# Patient Record
Sex: Male | Born: 1995 | Race: Black or African American | Hispanic: No | Marital: Single | Smoking: Light tobacco smoker
Health system: Southern US, Community
[De-identification: ages and names within clinical notes are randomized; demographics above are authoritative.]

---
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
## 2014-02-02 ENCOUNTER — Emergency Department: Payer: Self-pay | Admitting: Emergency Medicine

## 2014-11-21 ENCOUNTER — Emergency Department
Admission: EM | Admit: 2014-11-21 | Discharge: 2014-11-21 | Disposition: A | Discharge status: Home / Self Care | Payer: Self-pay | Attending: Emergency Medicine | Admitting: Emergency Medicine

## 2014-11-21 ENCOUNTER — Encounter: Payer: Self-pay | Admitting: Student

## 2014-11-21 DIAGNOSIS — Z202 Contact with and (suspected) exposure to infections with a predominantly sexual mode of transmission: Secondary | ICD-10-CM | POA: Insufficient documentation

## 2014-11-21 DIAGNOSIS — Z720 Tobacco use: Secondary | ICD-10-CM | POA: Insufficient documentation

## 2014-11-21 MED ORDER — METRONIDAZOLE 250 MG PO TABS
2000.0000 mg | ORAL_TABLET | Freq: Once | ORAL | Status: DC
  Filled 2014-11-21: qty 8

## 2014-11-21 MED ORDER — METRONIDAZOLE 250 MG PO TABS
2000.0000 mg | ORAL_TABLET | Freq: Once | ORAL | Status: AC
  Administered 2014-11-21: 2000 mg via ORAL

## 2014-11-21 NOTE — ED Provider Notes (Signed)
[facility] Emergency Department Provider Note  ____________________________________________  Time seen: Approximately 4:33 PM  I have reviewed the triage vital signs and the nursing notes.   HISTORY  Chief Complaint Exposure to STD    HPI Brian Santos is a 19 y.o. male male today status post STD. Patient has significant other was tested and treated for Trichomonas. He denies any symptoms but request for STD testing. Patient states that he has had history STD in the past.   History reviewed. No pertinent past medical history.  There are no active problems to display for this patient.   History reviewed. No pertinent past surgical history.  No current outpatient prescriptions on file.  Allergies Review of patient's allergies indicates no known allergies.  No family history on file.  Social History History  Substance Use Topics  . Smoking status: Light Tobacco Smoker  . Smokeless tobacco: Not on file  . Alcohol Use: Yes     Comment: 4 bottles liquor per week    Review of Systems Constitutional: No fever/chills Eyes: No visual changes. ENT: No sore throat. Cardiovascular: Denies chest pain. Respiratory: Denies shortness of breath. Gastrointestinal: No abdominal pain.  No nausea, no vomiting.  No diarrhea.  No constipation. Genitourinary: Negative for dysuria. Musculoskeletal: Negative for back pain. Skin: Negative for rash. Neurological: Negative for headaches, focal weakness or numbness.  10-point ROS otherwise negative.  ____________________________________________   PHYSICAL EXAM:  VITAL SIGNS: ED Triage Vitals  Enc Vitals Group     BP 11/21/14 1603 124/77 mmHg     Pulse Rate 11/21/14 1603 83     Resp 11/21/14 1603 18     Temp 11/21/14 1603 99 F (37.2 C)     Temp Source 11/21/14 1603 Oral     SpO2 11/21/14 1603 98 %     Weight 11/21/14 1603 170 lb (77.111 kg)     Height 11/21/14 1603 5\' 9"  (1.753 m)     Head Cir --       Peak Flow --      Pain Score --      Pain Loc --      Pain Edu? --      Excl. in GC? --     Constitutional: Alert and oriented. Well appearing and in no acute distress. Eyes: Conjunctivae are normal. PERRL. EOMI. Head: Atraumatic. Nose: No congestion/rhinnorhea. Mouth/Throat: Mucous membranes are moist.  Oropharynx non-erythematous. Neck: No stridor.  No cervical spine tenderness to palpation. Cardiovascular: Normal rate, regular rhythm. Grossly normal heart sounds.  Good peripheral circulation. Respiratory: Normal respiratory effort.  No retractions. Lungs CTAB. Gastrointestinal: Soft and nontender. No distention. No abdominal bruits. No CVA tenderness. Genitourinary: No penial lesions no urethral discharge. Musculoskeletal: No lower extremity tenderness nor edema.  No joint effusions. Neurologic:  Normal speech and language. No gross focal neurologic deficits are appreciated. No gait instability. Skin:  Skin is warm, dry and intact. No rash noted. Psychiatric: Mood and affect are normal. Speech and behavior are normal.  ____________________________________________   LABS (all labs ordered are listed, but only abnormal results are displayed)  Labs Reviewed - No data to display ____________________________________________  EKG   ____________________________________________  RADIOLOGY   ____________________________________________   PROCEDURES  Procedure(s) performed:   Critical Care performed: No  ____________________________________________   INITIAL IMPRESSION / ASSESSMENT AND PLAN / ED COURSE  Pertinent labs & imaging results that were available during my care of the patient were reviewed by me and considered in my medical  decision making (see chart for details). Asymptomatic STD exposure. We'll treated with 2 g of Flagyl have patient follow-up at the [facility] for further  evaluation. ____________________________________________   FINAL CLINICAL IMPRESSION(S) / ED DIAGNOSES  Final diagnoses:  Exposure to STD      Joni Reining, PA-C 11/21/14 1646  Jene Every, MD 11/22/14 2322

## 2014-11-21 NOTE — ED Notes (Addendum)
Pt here reporting exposure to STD. Requests testing for "all of them". Denies any symptoms.
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
## 2016-08-09 ENCOUNTER — Emergency Department (HOSPITAL_COMMUNITY)
Admission: EM | Admit: 2016-08-09 | Discharge: 2016-08-09 | Disposition: A | Discharge status: Home / Self Care | Payer: Self-pay | Attending: Emergency Medicine | Admitting: Emergency Medicine

## 2016-08-09 ENCOUNTER — Encounter (HOSPITAL_COMMUNITY): Payer: Self-pay | Admitting: *Deleted

## 2016-08-09 DIAGNOSIS — S0181XA Laceration without foreign body of other part of head, initial encounter: Secondary | ICD-10-CM

## 2016-08-09 DIAGNOSIS — Y999 Unspecified external cause status: Secondary | ICD-10-CM | POA: Insufficient documentation

## 2016-08-09 DIAGNOSIS — S01112A Laceration without foreign body of left eyelid and periocular area, initial encounter: Secondary | ICD-10-CM | POA: Insufficient documentation

## 2016-08-09 DIAGNOSIS — Y939 Activity, unspecified: Secondary | ICD-10-CM | POA: Insufficient documentation

## 2016-08-09 DIAGNOSIS — Z23 Encounter for immunization: Secondary | ICD-10-CM | POA: Insufficient documentation

## 2016-08-09 DIAGNOSIS — W228XXA Striking against or struck by other objects, initial encounter: Secondary | ICD-10-CM | POA: Insufficient documentation

## 2016-08-09 DIAGNOSIS — F17200 Nicotine dependence, unspecified, uncomplicated: Secondary | ICD-10-CM | POA: Insufficient documentation

## 2016-08-09 DIAGNOSIS — Y929 Unspecified place or not applicable: Secondary | ICD-10-CM | POA: Insufficient documentation

## 2016-08-09 MED ORDER — TETANUS-DIPHTH-ACELL PERTUSSIS 5-2.5-18.5 LF-MCG/0.5 IM SUSP
0.5000 mL | Freq: Once | INTRAMUSCULAR | Status: AC
  Administered 2016-08-09: 0.5 mL via INTRAMUSCULAR
  Filled 2016-08-09: qty 0.5

## 2016-08-09 MED ORDER — LIDOCAINE-EPINEPHRINE-TETRACAINE (LET) SOLUTION
3.0000 mL | Freq: Once | NASAL | Status: AC
  Administered 2016-08-09: 3 mL via TOPICAL
  Filled 2016-08-09: qty 3

## 2016-08-09 MED ORDER — LIDOCAINE HCL (PF) 1 % IJ SOLN
INTRAMUSCULAR | Status: AC
  Administered 2016-08-09: 5 mL
  Filled 2016-08-09: qty 5

## 2016-08-09 NOTE — Discharge Instructions (Signed)
Take Tylenol or ibuprofen for any discomfort. Cool compresses will help reduce swelling.
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
## 2016-08-18 ENCOUNTER — Encounter (HOSPITAL_COMMUNITY): Payer: Self-pay | Admitting: *Deleted

## 2016-08-18 ENCOUNTER — Emergency Department (HOSPITAL_COMMUNITY)
Admission: EM | Admit: 2016-08-18 | Discharge: 2016-08-18 | Disposition: A | Discharge status: Home / Self Care | Payer: Medicaid Other | Attending: Emergency Medicine | Admitting: Emergency Medicine

## 2016-08-18 DIAGNOSIS — Z4802 Encounter for removal of sutures: Secondary | ICD-10-CM | POA: Insufficient documentation

## 2016-08-18 DIAGNOSIS — F17200 Nicotine dependence, unspecified, uncomplicated: Secondary | ICD-10-CM | POA: Insufficient documentation
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
## 2016-08-18 NOTE — Discharge Instructions (Signed)
Return if any problems.

## 2016-08-18 NOTE — ED Triage Notes (Signed)
Pt hrere for suture removal from left eyebrow that was placed on the 14th.
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
## 2016-08-28 ENCOUNTER — Emergency Department
Admission: EM | Admit: 2016-08-28 | Discharge: 2016-08-28 | Disposition: A | Discharge status: Home / Self Care | Payer: Medicaid Other | Attending: Emergency Medicine | Admitting: Emergency Medicine

## 2016-08-28 ENCOUNTER — Encounter: Payer: Self-pay | Admitting: *Deleted

## 2016-08-28 DIAGNOSIS — F17200 Nicotine dependence, unspecified, uncomplicated: Secondary | ICD-10-CM | POA: Insufficient documentation

## 2016-08-28 DIAGNOSIS — Z202 Contact with and (suspected) exposure to infections with a predominantly sexual mode of transmission: Secondary | ICD-10-CM | POA: Insufficient documentation

## 2016-08-28 LAB — CHLAMYDIA/NGC RT PCR (ARMC ONLY)
CHLAMYDIA TR: NOT DETECTED
N GONORRHOEAE: NOT DETECTED

## 2016-08-28 MED ORDER — METRONIDAZOLE 500 MG PO TABS
2000.0000 mg | ORAL_TABLET | Freq: Once | ORAL | Status: AC
  Administered 2016-08-28: 2000 mg via ORAL
  Filled 2016-08-28: qty 4

## 2016-08-28 MED ORDER — PENICILLIN G BENZATHINE 1200000 UNIT/2ML IM SUSP
2.4000 10*6.[IU] | Freq: Once | INTRAMUSCULAR | Status: AC
  Administered 2016-08-28: 2.4 10*6.[IU] via INTRAMUSCULAR
  Filled 2016-08-28: qty 4

## 2016-08-28 NOTE — ED Notes (Addendum)
Pt phone number is [phone] wants to be updated. Given to registration

## 2016-08-28 NOTE — ED Notes (Signed)
Pt c/o blister to hands and feet x1wk. Darkened area noted. Pt states he also would like to have STD check, denies any dysuria or discharge from penis.

## 2016-08-28 NOTE — ED Triage Notes (Signed)
Pt presents to ED reporting blisters to hand and foot. No blisters noted in his mouth. Pt also reports having been exposed to STD and requesting to be tested.
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
## 2016-08-29 LAB — RPR, QUANT+TP ABS (REFLEX)
Rapid Plasma Reagin, Quant: 1:32 {titer} — ABNORMAL HIGH
T Pallidum Abs: POSITIVE — AB

## 2016-08-29 LAB — HIV ANTIBODY (ROUTINE TESTING W REFLEX): HIV SCREEN 4TH GENERATION: NONREACTIVE

## 2016-08-29 LAB — RPR: RPR: REACTIVE — AB

## 2016-09-01 ENCOUNTER — Telehealth: Payer: Self-pay | Admitting: Emergency Medicine

## 2016-09-01 NOTE — Telephone Encounter (Signed)
Called patient with syphilis results.  He was treated in the ED, but I advised that he follow up with [facility] to make sure he needs no further treatment.  He agrees.

## 2016-09-02 ENCOUNTER — Telehealth: Payer: Self-pay | Admitting: Emergency Medicine
